# Patient Record
Sex: Female | Born: 1985 | Hispanic: No | Marital: Married | Smoking: Never smoker
Health system: Southern US, Community
[De-identification: ages and names within clinical notes are randomized; demographics above are authoritative.]

## PROBLEM LIST (undated history)

## (undated) ENCOUNTER — Inpatient Hospital Stay: Payer: Self-pay

## (undated) DIAGNOSIS — Z789 Other specified health status: Secondary | ICD-10-CM

## (undated) HISTORY — DX: Other specified health status: Z78.9

## (undated) HISTORY — PX: NO PAST SURGERIES: SHX2092

---

## 2015-04-15 ENCOUNTER — Emergency Department
Admission: EM | Admit: 2015-04-15 | Discharge: 2015-04-15 | Disposition: A | Discharge status: Home / Self Care | Payer: Medicaid Other | Attending: Emergency Medicine | Admitting: Emergency Medicine

## 2015-04-15 ENCOUNTER — Emergency Department: Payer: Medicaid Other

## 2015-04-15 ENCOUNTER — Encounter: Payer: Self-pay | Admitting: Emergency Medicine

## 2015-04-15 DIAGNOSIS — R42 Dizziness and giddiness: Secondary | ICD-10-CM | POA: Diagnosis not present

## 2015-04-15 DIAGNOSIS — O2342 Unspecified infection of urinary tract in pregnancy, second trimester: Secondary | ICD-10-CM | POA: Diagnosis not present

## 2015-04-15 DIAGNOSIS — Z3A16 16 weeks gestation of pregnancy: Secondary | ICD-10-CM | POA: Diagnosis not present

## 2015-04-15 DIAGNOSIS — N390 Urinary tract infection, site not specified: Secondary | ICD-10-CM

## 2015-04-15 DIAGNOSIS — O9989 Other specified diseases and conditions complicating pregnancy, childbirth and the puerperium: Secondary | ICD-10-CM | POA: Diagnosis present

## 2015-04-15 DIAGNOSIS — R109 Unspecified abdominal pain: Secondary | ICD-10-CM

## 2015-04-15 DIAGNOSIS — O26899 Other specified pregnancy related conditions, unspecified trimester: Secondary | ICD-10-CM

## 2015-04-15 LAB — URINALYSIS COMPLETE WITH MICROSCOPIC (ARMC ONLY)
Bilirubin Urine: NEGATIVE
GLUCOSE, UA: NEGATIVE mg/dL
Ketones, ur: NEGATIVE mg/dL
Nitrite: NEGATIVE
PROTEIN: NEGATIVE mg/dL
SPECIFIC GRAVITY, URINE: 1.025 (ref 1.005–1.030)
pH: 5 (ref 5.0–8.0)

## 2015-04-15 LAB — CBC
HEMATOCRIT: 36.2 % (ref 35.0–47.0)
HEMOGLOBIN: 12.3 g/dL (ref 12.0–16.0)
MCH: 29.8 pg (ref 26.0–34.0)
MCHC: 34 g/dL (ref 32.0–36.0)
MCV: 87.8 fL (ref 80.0–100.0)
Platelets: 232 10*3/uL (ref 150–440)
RBC: 4.12 MIL/uL (ref 3.80–5.20)
RDW: 13.5 % (ref 11.5–14.5)
WBC: 10.4 10*3/uL (ref 3.6–11.0)

## 2015-04-15 LAB — COMPREHENSIVE METABOLIC PANEL
ALBUMIN: 3.7 g/dL (ref 3.5–5.0)
ALT: 12 U/L — ABNORMAL LOW (ref 14–54)
ANION GAP: 6 (ref 5–15)
AST: 16 U/L (ref 15–41)
Alkaline Phosphatase: 38 U/L (ref 38–126)
BUN: 7 mg/dL (ref 6–20)
CHLORIDE: 107 mmol/L (ref 101–111)
CO2: 22 mmol/L (ref 22–32)
Calcium: 9.3 mg/dL (ref 8.9–10.3)
Creatinine, Ser: 0.32 mg/dL — ABNORMAL LOW (ref 0.44–1.00)
GFR calc Af Amer: >60 mL/min (ref >60)
GFR calc non Af Amer: >60 mL/min (ref >60)
GLUCOSE: 88 mg/dL (ref 65–99)
POTASSIUM: 3.6 mmol/L (ref 3.5–5.1)
Sodium: 135 mmol/L (ref 135–145)
Total Bilirubin: 0.2 mg/dL — ABNORMAL LOW (ref 0.3–1.2)
Total Protein: 7 g/dL (ref 6.5–8.1)

## 2015-04-15 LAB — HCG, QUANTITATIVE, PREGNANCY: HCG, BETA CHAIN, QUANT, S: 45629 m[IU]/mL — AB (ref <5)

## 2015-04-15 LAB — POCT PREGNANCY, URINE: PREG TEST UR: POSITIVE — AB

## 2015-04-15 LAB — LIPASE, BLOOD: LIPASE: 21 U/L (ref 11–51)

## 2015-04-15 MED ORDER — CEPHALEXIN 500 MG PO CAPS: 500.0000 mg | ORAL_CAPSULE | Freq: Three times a day (TID) | ORAL | Status: AC

## 2015-04-15 MED ORDER — CEPHALEXIN 500 MG PO CAPS
500.0000 mg | ORAL_CAPSULE | Freq: Once | ORAL | Status: AC
  Administered 2015-04-15: 500 mg via ORAL
  Filled 2015-04-15 (×2): qty 1

## 2015-04-15 MED ORDER — PRENATAL MULTIVITAMIN CH: 1.0000 | ORAL_TABLET | Freq: Every day | ORAL | Status: DC

## 2015-04-15 NOTE — ED Notes (Signed)
Pt is [redacted] weeks pregnant, pt reports dizziness since getting pregnant, pt reports dizziness occurs occasionally

## 2015-04-15 NOTE — ED Provider Notes (Signed)
[facility] Emergency Department Provider Note  ____________________________________________  Time seen: Approximately 515 PM  I have reviewed the triage vital signs and the nursing notes.   HISTORY  Chief Complaint Dizziness  Patient is Arabic speaking. I did bring the translator iPod in the room but the network was not able to communicate and therefore I was not able to obtain a translator. The patient's brother had to be used for Arabic translation.  HPI Tzirel Norva PavlovSalah Casale is a 29 y.o. female who is a G4 P3 who is presenting today at about14 weeks pregnant. The patient denies any vaginal bleeding or discharge. Says that she occasionally feels dizzy as well but is denying any dizziness or abdominal pain at this time. Says the dizziness is worse when she moves her head from side to side. Has not had anything like this in her previous pregnancies. Does not report any headache. Denies any dizziness right now. Denies any ringing or roaring in her ears. Patient says that the abdominal pain is to the bilateral sides of her abdomen and intermittent. She denies any pain with urination or frequency.   History reviewed. No pertinent past medical history.  There are no active problems to display for this patient.   History reviewed. No pertinent past surgical history.  No current outpatient prescriptions on file.  Allergies Review of patient's allergies indicates no known allergies.  History reviewed. No pertinent family history.  Social History Social History  Substance Use Topics  . Smoking status: Never Smoker   . Smokeless tobacco: None  . Alcohol Use: No    Review of Systems Constitutional: No fever/chills Eyes: No visual changes. ENT: No sore throat. Cardiovascular: Denies chest pain. Respiratory: Denies shortness of breath. Gastrointestinal:   No nausea, no vomiting.  No diarrhea.  No constipation. Genitourinary: Negative for  dysuria. Musculoskeletal: Negative for back pain. Skin: Negative for rash. Neurological: Negative for headaches, focal weakness or numbness.  10-point ROS otherwise negative.  ____________________________________________   PHYSICAL EXAM:  VITAL SIGNS: ED Triage Vitals  Enc Vitals Group     BP 04/15/15 1348 105/58 mmHg     Pulse Rate 04/15/15 1348 89     Resp 04/15/15 1348 20     Temp 04/15/15 1348 98.2 F (36.8 C)     Temp Source 04/15/15 1348 Oral     SpO2 04/15/15 1348 99 %     Weight --      Height --      Head Cir --      Peak Flow --      Pain Score 04/15/15 1348 4     Pain Loc --      Pain Edu? --      Excl. in GC? --     Constitutional: Alert and oriented. Well appearing and in no acute distress. Eyes: Conjunctivae are normal. PERRL. EOMI. Head: Atraumatic. Nose: No congestion/rhinnorhea. Mouth/Throat: Mucous membranes are moist.  Neck: No stridor.   Cardiovascular: Normal rate, regular rhythm. Grossly normal heart sounds.  Good peripheral circulation. Respiratory: Normal respiratory effort.  No retractions. Lungs CTAB. Gastrointestinal: Due to patients religion she did not want me palpating her abdomen. The patient and the family were also refusing a pelvic exam.  Musculoskeletal: No swelling of the ankles or feet. Neurologic:  Normal speech and language. No gross focal neurologic deficits are appreciated. No gait instability. Skin:  Skin is warm, dry and intact. No rash noted. Psychiatric: Mood and affect are normal. Speech and behavior  are normal.  ____________________________________________   LABS (all labs ordered are listed, but only abnormal results are displayed)  Labs Reviewed  COMPREHENSIVE METABOLIC PANEL - Abnormal; Notable for the following:    Creatinine, Ser 0.32 (*)    ALT 12 (*)    Total Bilirubin 0.2 (*)    All other components within normal limits  URINALYSIS COMPLETEWITH MICROSCOPIC ([facility] ONLY) - Abnormal; Notable for the  following:    Color, Urine AMBER (*)    APPearance CLOUDY (*)    Hgb urine dipstick 1+ (*)    Leukocytes, UA 3+ (*)    Bacteria, UA RARE (*)    Squamous Epithelial / LPF 6-30 (*)    All other components within normal limits  HCG, QUANTITATIVE, PREGNANCY - Abnormal; Notable for the following:    hCG, Beta Chain, Quant, S 16109 (*)    All other components within normal limits  POCT PREGNANCY, URINE - Abnormal; Notable for the following:    Preg Test, Ur POSITIVE (*)    All other components within normal limits  URINE CULTURE  LIPASE, BLOOD  CBC  POC URINE PREG, ED   ____________________________________________  EKG   ____________________________________________  RADIOLOGY  Single live IUP at estimated gestational age of [redacted] weeks 0 days. ____________________________________________   PROCEDURES  ____________________________________________   INITIAL IMPRESSION / ASSESSMENT AND PLAN / ED COURSE  Pertinent labs & imaging results that were available during my care of the patient were reviewed by me and considered in my medical decision making (see chart for details).  Exam limited because no abdominal or genitourinary exam permitted by the patient or family. Unfortunately all of the emergency doctors on staff right now her female in side do not have the option of referring to a female doctor for the rest of the exam.  ----------------------------------------- 7:26 PM on 04/15/2015 -----------------------------------------  Patient resting comfortable right now without any dizziness or abdominal pain. Possible dizziness related to urinary tract infection. History and physical exam do not point any central cause. We'll discharge with Keflex. Limited exam secondary to the patient's family do not want me to lay hands because of cultural/religious restrictions. However, the labs as well as general appearance a reassuring. ____________________________________________   FINAL  CLINICAL IMPRESSION(S) / ED DIAGNOSES  Final diagnoses:  Abdominal pain affecting pregnancy  Dizziness   UTI.     Myrna Blazer, MD 04/15/15 [phone]

## 2015-04-15 NOTE — ED Notes (Signed)
Pt to ed with c/o abd pain and dizziness.  Pt states she is approximately [redacted] weeks pregnant.  Pt denies vaginal bleeding. ( Interpreter Asmaa [phone].)

## 2015-04-18 LAB — URINE CULTURE

## 2015-04-27 NOTE — L&D Delivery Note (Signed)
Patient is 30 y.o. [id number] 6039w1d admitted for active labor.   Delivery Note At 5:20 AM a healthy female was delivered via Vaginal, Spontaneous Delivery (Presentation: Right Occiput Anterior). APGAR: 9, 9; weight  Pending.   Placenta status: Intact, Spontaneous.  Cord: 3 vessels with the following complications: None.    Anesthesia: Epidural  Episiotomy: None Lacerations: 2nd degree Suture Repair: 3.0 vicryl Est. Blood Loss (mL): 100  Mom to postpartum.  Baby to Couplet care / Skin to Skin.  [facility] Rumley 09/17/2015, 6:21 AM  OB fellow attestation: Patient is a [id number] at 6139w1d who was admitted in active labor, scant PNC  I was gloved and present for delivery in its entirety.  Second stage of labor progressed.   Complications: none  Lacerations: 2nd degree  EBL: 100  Federico FlakeKimberly Niles Abimael Zeiter, MD 6:54 AM

## 2015-05-24 ENCOUNTER — Encounter: Payer: Self-pay | Admitting: *Deleted

## 2015-05-24 ENCOUNTER — Observation Stay
Admission: EM | Admit: 2015-05-24 | Discharge: 2015-05-24 | Disposition: A | Discharge status: Home / Self Care | Payer: Medicaid Other | Attending: Obstetrics and Gynecology | Admitting: Obstetrics and Gynecology

## 2015-05-24 DIAGNOSIS — Z3A21 21 weeks gestation of pregnancy: Secondary | ICD-10-CM | POA: Diagnosis not present

## 2015-05-24 DIAGNOSIS — O0932 Supervision of pregnancy with insufficient antenatal care, second trimester: Secondary | ICD-10-CM | POA: Diagnosis not present

## 2015-05-24 DIAGNOSIS — O26892 Other specified pregnancy related conditions, second trimester: Secondary | ICD-10-CM | POA: Diagnosis not present

## 2015-05-24 DIAGNOSIS — R109 Unspecified abdominal pain: Secondary | ICD-10-CM | POA: Diagnosis not present

## 2015-05-24 LAB — URINALYSIS COMPLETE WITH MICROSCOPIC (ARMC ONLY)
Bilirubin Urine: NEGATIVE
Glucose, UA: NEGATIVE mg/dL
Nitrite: NEGATIVE
PH: 5 (ref 5.0–8.0)
Protein, ur: 30 mg/dL — AB
SPECIFIC GRAVITY, URINE: 1.024 (ref 1.005–1.030)

## 2015-05-24 LAB — CBC WITH DIFFERENTIAL/PLATELET
Basophils Absolute: 0 10*3/uL (ref 0–0.1)
Basophils Relative: 0 %
EOS PCT: 4 %
Eosinophils Absolute: 0.4 10*3/uL (ref 0–0.7)
HCT: 32.1 % — ABNORMAL LOW (ref 35.0–47.0)
HEMOGLOBIN: 11.2 g/dL — AB (ref 12.0–16.0)
Lymphocytes Relative: 19 %
Lymphs Abs: 2.1 10*3/uL (ref 1.0–3.6)
MCH: 30.6 pg (ref 26.0–34.0)
MCHC: 35 g/dL (ref 32.0–36.0)
MCV: 87.6 fL (ref 80.0–100.0)
Monocytes Absolute: 0.7 10*3/uL (ref 0.2–0.9)
Monocytes Relative: 6 %
NEUTROS PCT: 71 %
Neutro Abs: 8.1 10*3/uL — ABNORMAL HIGH (ref 1.4–6.5)
PLATELETS: 225 10*3/uL (ref 150–440)
RBC: 3.66 MIL/uL — AB (ref 3.80–5.20)
RDW: 13.1 % (ref 11.5–14.5)
WBC: 11.5 10*3/uL — AB (ref 3.6–11.0)

## 2015-05-24 LAB — ABO/RH: ABO/RH(D): A NEG

## 2015-05-24 MED ORDER — NITROFURANTOIN MONOHYD MACRO 100 MG PO CAPS
100.0000 mg | ORAL_CAPSULE | Freq: Two times a day (BID) | ORAL | Status: AC
  Administered 2015-05-24: 100 mg via ORAL
  Filled 2015-05-24: qty 1

## 2015-05-24 NOTE — OB Triage Note (Signed)
Abdominal cramping, has not seen a provider during this pregnancy. Ultrasound done on 04/15/15. Alison Salazar

## 2015-05-24 NOTE — OB Triage Provider Note (Signed)
L&D OB Triage Note  Alison Salazar is a 30 y.o. G4P0 female at Unknown, EDD Estimated Date of Delivery: None noted. who presented to triage for complaints of mid-abdominal pains intermittent x 2 weeks. No prenaatl care to date, did have u/s here on 04/15/15 with EDC found to be 09/30/15 and EGA 16w at that time. Patient reports she is [id number] with term deliveries and no health issues. Is not taking any medications at this time.  She was evaluated by the nurses with no significant findings/findings significant for PTL. Vital signs stable.  O: abdomen soft and nontender, FH c/w 21 weeks, no contractions noted on EFM, FHT 160. Urine sent for analysis and Prenatal labs obtained.   A: abdominal pain of unknown etiology at [redacted] weeks gestation. No prenatal care to date   Mode: Doppler Baseline Rate (A): 150 bpm              A; abdominal pain of unknown etiology at 21 weeks, No prenatal care.  P: discharge, to set up at appointment with my office within one week to establish care and anatomy scan.      Kentrell Hallahan Suzan Nailer, CNM

## 2015-05-25 LAB — URINE CULTURE

## 2015-05-25 LAB — RUBELLA ANTIBODY, IGM: Rubella IgM: <20 AU/mL (ref 0.0–19.9)

## 2015-05-25 LAB — HIV ANTIBODY (ROUTINE TESTING W REFLEX): HIV Screen 4th Generation wRfx: NONREACTIVE

## 2015-05-25 LAB — VARICELLA ZOSTER ANTIBODY, IGG: VARICELLA IGG: 141 {index} — AB (ref >165)

## 2015-05-25 LAB — HEPATITIS B CORE ANTIBODY, IGM: HEP B C IGM: NEGATIVE

## 2015-05-25 LAB — RPR: RPR: NONREACTIVE

## 2015-06-04 ENCOUNTER — Ambulatory Visit (INDEPENDENT_AMBULATORY_CARE_PROVIDER_SITE_OTHER): Payer: Medicaid Other | Admitting: Obstetrics and Gynecology

## 2015-06-04 VITALS — BP 87/49 | HR 90 | Ht 62.0 in | Wt 142.7 lb

## 2015-06-04 DIAGNOSIS — Z87440 Personal history of urinary (tract) infections: Secondary | ICD-10-CM | POA: Diagnosis not present

## 2015-06-04 DIAGNOSIS — Z113 Encounter for screening for infections with a predominantly sexual mode of transmission: Secondary | ICD-10-CM | POA: Diagnosis not present

## 2015-06-04 DIAGNOSIS — O9989 Other specified diseases and conditions complicating pregnancy, childbirth and the puerperium: Secondary | ICD-10-CM | POA: Diagnosis not present

## 2015-06-04 DIAGNOSIS — O360120 Maternal care for anti-D [Rh] antibodies, second trimester, not applicable or unspecified: Secondary | ICD-10-CM

## 2015-06-04 DIAGNOSIS — O0932 Supervision of pregnancy with insufficient antenatal care, second trimester: Secondary | ICD-10-CM | POA: Diagnosis not present

## 2015-06-04 DIAGNOSIS — Z2839 Other underimmunization status: Secondary | ICD-10-CM

## 2015-06-04 DIAGNOSIS — Z283 Underimmunization status: Secondary | ICD-10-CM

## 2015-06-04 DIAGNOSIS — Z789 Other specified health status: Secondary | ICD-10-CM | POA: Diagnosis not present

## 2015-06-04 DIAGNOSIS — O09899 Supervision of other high risk pregnancies, unspecified trimester: Secondary | ICD-10-CM

## 2015-06-04 NOTE — Patient Instructions (Signed)
Glucose Tolerance Test The glucose tolerance test (GTT) is one of several tests used to diagnose diabetes mellitus. The GTT is a blood test, and it may include a urine test as well. The GTT checks to see how your body processes sugar (glucose). For this test, you will consume a drink containing a high level of glucose. Your blood glucose levels will be checked before you consume the drink and then again 1, 2, 3, and possibly 4 hours after you consume it. Your health care provider may recommend that you have the GTT if you:  Have a family history of diabetes.   Are very overweight (obese).   Have experienced infections that keep coming back.   Have had numerous cuts or wounds that did not heal quickly, especially on your legs and feet.   Are a woman and have a history of giving birth to very large babies or a history of repeated fetal loss (stillbirth).  Have had glucose in your urine or high blood sugar:   During pregnancy.   After a heart attack, surgery, or prolonged periods of high stress.  The GTT lasts 3-4 hours. Other than the glucose solution, you will not be allowed to eat or drink anything during the test. You must remain at the testing location to make sure that your blood and urine samples are taken on time. PREPARATION FOR TEST Eat normally for 3 days prior to the GTT test, including having plenty of carbohydrate-rich foods. Do not eat or drink anything except water during the final 12 hours before the test. You should not smoke or exercise during the test. In addition, your health care provider may ask you to stop taking certain medicines before the test. RESULTS It is your responsibility to obtain your test results. Ask the lab or department performing the test when and how you will get your results. Contact your health care provider to discuss any questions you have about your results. Range of Normal Values Ranges for normal values may vary among different labs and  hospitals. You should always check with your health care provider after having lab work or other tests done to discuss whether your values are considered within normal limits.  Normal levels of blood glucose are as follows:  Fasting: less than 110 mg/dL or less than 6.1 mmol/L (SI units).  1 hour after consuming the glucose drink: less than 200 mg/dL or less than 11.1 mmol/L.  2 hours after consuming the glucose drink: less than 140 mg/dL or less than 7.8 mmol/L.  3 hours after consuming the glucose drink: 70-115 mg/dL or less than 6.4 mmol/L.  4 hours after consuming the glucose drink: 70-115 mg/dL or less than 6.4 mmol/L. The normal result for the urine test is negative, meaning that glucose is absent from your urine. Some substances can interfere with GTT results. These may include:  Blood pressure and heart failure medicines, including beta blockers, furosemide, and thiazides.   Anti-inflammatory medicines, including aspirin.   Nicotine.   Some psychiatric medicines.   Oral contraceptives.   Diuretics or corticosteroids. Meaning of Results Outside Normal Value Ranges GTT test results that are above normal values may indicate health problems, such as:  Diabetes mellitus.   Acute stress response.   Cushing syndrome.   Tumors such as pheochromocytoma or glucagonoma.   Chronic renal failure.   Pancreatitis.   Hyperthyroidism.   Current infection.  Discuss your test results with your health care provider. He or she will use the results   to make a diagnosis and determine a treatment plan that is right for you.   This information is not intended to replace advice given to you by your health care provider. Make sure you discuss any questions you have with your health care provider.   Document Released: 05/05/2004 Document Revised: 05/03/2014 Document Reviewed: 08/17/2013 Elsevier Interactive Patient Education 2016 [employer].     Prenatal Care WHAT IS  PRENATAL CARE?  Prenatal care is the process of caring for a pregnant woman before she gives birth. Prenatal care makes sure that she and her baby remain as healthy as possible throughout pregnancy. Prenatal care may be provided by a midwife, family practice health care provider, or a childbirth and pregnancy specialist (obstetrician). Prenatal care may include physical examinations, testing, treatments, and education on nutrition, lifestyle, and social support services. WHY IS PRENATAL CARE SO IMPORTANT?  Early and consistent prenatal care increases the chance that you and your baby will remain healthy throughout your pregnancy. This type of care also decreases a baby's risk of being born too early (prematurely), or being born smaller than expected (small for gestational age). Any underlying medical conditions you may have that could pose a risk during your pregnancy are discussed during prenatal care visits. You will also be monitored regularly for any new conditions that may arise during your pregnancy so they can be treated quickly and effectively. WHAT HAPPENS DURING PRENATAL CARE VISITS? Prenatal care visits may include the following: Discussion Tell your health care provider about any new signs or symptoms you have experienced since your last visit. These might include:  Nausea or vomiting.  Increased or decreased level of energy.  Difficulty sleeping.  Back or leg pain.  Weight changes.  Frequent urination.  Shortness of breath with physical activity.  Changes in your skin, such as the development of a rash or itchiness.  Vaginal discharge or bleeding.  Feelings of excitement or nervousness.  Changes in your baby's movements. You may want to write down any questions or topics you want to discuss with your health care provider and bring them with you to your appointment. Examination During your first prenatal care visit, you will likely have a complete physical exam. Your  health care provider will often examine your vagina, cervix, and the position of your uterus, as well as check your heart, lungs, and other body systems. As your pregnancy progresses, your health care provider will measure the size of your uterus and your baby's position inside your uterus. He or she may also examine you for early signs of labor. Your prenatal visits may also include checking your blood pressure and, after about 10-12 weeks of pregnancy, listening to your baby's heartbeat. Testing Regular testing often includes:  Urinalysis. This checks your urine for glucose, protein, or signs of infection.  Blood count. This checks the levels of white and red blood cells in your body.  Tests for sexually transmitted infections (STIs). Testing for STIs at the beginning of pregnancy is routinely done and is required in many states.  Antibody testing. You will be checked to see if you are immune to certain illnesses, such as rubella, that can affect a developing fetus.  Glucose screen. Around 24-28 weeks of pregnancy, your blood glucose level will be checked for signs of gestational diabetes. Follow-up tests may be recommended.  Group B strep. This is a bacteria that is commonly found inside a woman's vagina. This test will inform your health care provider if you need an antibiotic  to reduce the amount of this bacteria in your body prior to labor and childbirth.  Ultrasound. Many pregnant women undergo an ultrasound screening around 18-20 weeks of pregnancy to evaluate the health of the fetus and check for any developmental abnormalities.  HIV (human immunodeficiency virus) testing. Early in your pregnancy, you will be screened for HIV. If you are at high risk for HIV, this test may be repeated during your third trimester of pregnancy. You may be offered other testing based on your age, personal or family medical history, or other factors.  HOW OFTEN SHOULD I PLAN TO SEE MY HEALTH CARE PROVIDER  FOR PRENATAL CARE? Your prenatal care check-up schedule depends on any medical conditions you have before, or develop during, your pregnancy. If you do not have any underlying medical conditions, you will likely be seen for checkups:  Monthly, during the first 6 months of pregnancy.  Twice a month during months 7 and 8 of pregnancy.  Weekly starting in the 9th month of pregnancy and until delivery. If you develop signs of early labor or other concerning signs or symptoms, you may need to see your health care provider more often. Ask your health care provider what prenatal care schedule is best for you. WHAT CAN I DO TO KEEP MYSELF AND MY BABY AS HEALTHY AS POSSIBLE DURING MY PREGNANCY?  Take a prenatal vitamin containing 400 micrograms (0.4 mg) of folic acid every day. Your health care provider may also ask you to take additional vitamins such as iodine, vitamin D, iron, copper, and zinc.  Take 1500-2000 mg of calcium daily starting at your 20th week of pregnancy until you deliver your baby.  Make sure you are up to date on your vaccinations. Unless directed otherwise by your health care provider:  You should receive a tetanus, diphtheria, and pertussis (Tdap) vaccination between the 27th and 36th week of your pregnancy, regardless of when your last Tdap immunization occurred. This helps protect your baby from whooping cough (pertussis) after he or she is born.  You should receive an annual inactivated influenza vaccine (IIV) to help protect you and your baby from influenza. This can be done at any point during your pregnancy.  Eat a well-rounded diet that includes:  Fresh fruits and vegetables.  Lean proteins.  Calcium-rich foods such as milk, yogurt, hard cheeses, and dark, leafy greens.  Whole grain breads.  Do noteat seafood high in mercury, including:  Swordfish.  Tilefish.  Shark.  King mackerel.  More than 6 oz tuna per week.  Do not eat:  Raw or undercooked meats  or eggs.  Unpasteurized foods, such as soft cheeses (brie, blue, or feta), juices, and milks.  Lunch meats.  Hot dogs that have not been heated until they are steaming.  Drink enough water to keep your urine clear or pale yellow. For many women, this may be 10 or more 8 oz glasses of water each day. Keeping yourself hydrated helps deliver nutrients to your baby and may prevent the start of pre-term uterine contractions.  Do not use any tobacco products including cigarettes, chewing tobacco, or electronic cigarettes. If you need help quitting, ask your health care provider.  Do not drink beverages containing alcohol. No safe level of alcohol consumption during pregnancy has been determined.  Do not use any illegal drugs. These can harm your developing baby or cause a miscarriage.  Ask your health care provider or pharmacist before taking any prescription or over-the-counter medicines, herbs, or supplements.  Limit  your caffeine intake to no more than 200 mg per day.  Exercise. Unless told otherwise by your health care provider, try to get 30 minutes of moderate exercise most days of the week. Do not  do high-impact activities, contact sports, or activities with a high risk of falling, such as horseback riding or downhill skiing.  Get plenty of rest.  Avoid anything that raises your body temperature, such as hot tubs and saunas.  If you own a cat, do not empty its litter box. Bacteria contained in cat feces can cause an infection called toxoplasmosis. This can result in serious harm to the fetus.  Stay away from chemicals such as insecticides, lead, mercury, and cleaning or paint products that contain solvents.  Do not have any X-rays taken unless medically necessary.  Take a childbirth and breastfeeding preparation class. Ask your health care provider if you need a referral or recommendation.   This information is not intended to replace advice given to you by your health care  provider. Make sure you discuss any questions you have with your health care provider.   Document Released: 04/15/2003 Document Revised: 05/03/2014 Document Reviewed: 06/27/2013 Elsevier Interactive Patient Education 2016 Else[employer] Inc[employer]    Rh Incompatibility Rh incompatibility is a condition that occurs during pregnancy if a woman has Rh-negative blood and her baby has Rh-positive blood. "Rh-negative" and "Rh-positive" refer to whether or not the blood has an Rh factor. An Rh factor is a specific protein found on the surface of red blood cells. If a woman has Rh factor, she is Rh-positive. If she does not have an Rh factor, she is Rh-negative. Having or not having an Rh factor does not affect the mother's general health. However, it can cause problems during pregnancy.  WHAT KIND OF PROBLEMS CAN Rh INCOMPATIBILITY CAUSE? During pregnancy, blood from the baby can cross into the mother's bloodstream, especially during delivery. If a mother is Rh-negative and the baby is Rh-positive, the mother's defense system will react to the baby's blood as if it was a foreign substance and will create proteins (antibodies). This is called sensitization. Once the mother is sensitized, her Rh antibodies will cross the placenta to the baby and attack the baby's Rh-positive blood as if it is a harmful substance.  Rh incompatibility can also happen if the Rh-negative pregnant woman is exposed to the Rh factor during a blood transfusion with Rh-positive blood.  HOW DOES THIS CONDITION AFFECT MY BABY? The Rh antibodies that attack and destroy the baby's red blood cells can lead to hemolytic disease in the baby. Hemolytic disease is when the red blood cells break down. This can cause:   Yellowing of the skin and eyes (jaundice).  The body to not have enough healthy red blood cells (anemia).   Brain damage.   Heart failure.   Death.  These antibodies usually do not cause problems during a first pregnancy. This  is because the blood from the baby often times crosses into the mother's bloodstream during delivery, and the baby is born before many of the antibodies can develop. However, the antibodies stay in your body once they have formed. Because of this, Rh incompatibility is more likely to cause problems in second or later pregnancies (if the baby is Rh-positive).  HOW IS THIS CONDITION DIAGNOSED? When a woman becomes pregnant, blood tests may be done to find out her blood type and Rh factor. If the woman is Rh-negative, she also may have another blood test called an  antibody screen. The antibody screen shows whether she has Rh antibodies in her blood. If she does, it means she was exposed to Rh-positive blood before, and she is at risk for Rh incompatibility.  To find out whether the baby is developing hemolytic anemia and how serious it is, caregivers may use more advanced tests, such as ultrasonography (commonly known as ultrasound).  HOW IS Rh INCOMPATIBILITY TREATED?  Rh incompatibility is treated with a shot of medicine called Rho (D) immune globulin. This medicine keeps the woman's body from making antibodies that can cause serious problems in the baby or future babies.  Two shots will be given, one at around your seventh month of pregnancy and the other within 72 hours of your baby being born. If you are Rh-negative, you will need this medicine every time you have a baby with Rh-positive blood. If you already have antibodies in your blood, Rho (D) immune globulin will not help. Your doctor will not give you this medicine, but will watch your pregnancy closely for problems instead.  This shot may also be given to an Rh-negative woman when the risk of blood transfer between the mom and baby is high. The risk is high with:   An amniocentesis.   A miscarriage or an abortion.   An ectopic pregnancy.   Any vaginal bleeding during pregnancy.    This information is not intended to replace advice  given to you by your health care provider. Make sure you discuss any questions you have with your health care provider.   Document Released: 10/02/2001 Document Revised: 04/17/2013 Document Reviewed: 07/25/2012 Elsevier Interactive Patient Education [employer]

## 2015-06-04 NOTE — Progress Notes (Signed)
Pt unable to void for urine screenings.

## 2015-06-07 ENCOUNTER — Encounter: Payer: Self-pay | Admitting: Obstetrics and Gynecology

## 2015-06-07 DIAGNOSIS — O26899 Other specified pregnancy related conditions, unspecified trimester: Secondary | ICD-10-CM

## 2015-06-07 DIAGNOSIS — Z6791 Unspecified blood type, Rh negative: Secondary | ICD-10-CM | POA: Insufficient documentation

## 2015-06-07 DIAGNOSIS — O9989 Other specified diseases and conditions complicating pregnancy, childbirth and the puerperium: Secondary | ICD-10-CM

## 2015-06-07 DIAGNOSIS — O09899 Supervision of other high risk pregnancies, unspecified trimester: Secondary | ICD-10-CM | POA: Insufficient documentation

## 2015-06-07 DIAGNOSIS — Z2839 Other underimmunization status: Secondary | ICD-10-CM | POA: Insufficient documentation

## 2015-06-07 DIAGNOSIS — O0933 Supervision of pregnancy with insufficient antenatal care, third trimester: Secondary | ICD-10-CM | POA: Insufficient documentation

## 2015-06-07 DIAGNOSIS — Z283 Underimmunization status: Secondary | ICD-10-CM | POA: Insufficient documentation

## 2015-06-07 NOTE — Progress Notes (Signed)
OBSTETRIC CLINIC VISIT PROGRESS NOTE  Subjective:   Alison Salazar is a 30 y.o. [phone] [redacted]w[redacted]d Arabic speaking female being seen today for her obstetrical visit.  Estimated Date of Delivery: 09/30/15 based on 16 week limited ER sono (performed 04/15/2015).  She is following up from an Emergency Room visit last week for abdominal pain.  Patient was diagnosed with a UTI at that visit.  Her OB history is significant for SVD x 3 (denies complications in pregnancy or delivery), and no prenatal care to date.   Patient reports no complaints today.  Denies contractions, vaginal bleeding or leaking of fluid.  Reports good fetal movement.  Notes that she desires to know the sex of the baby.   The following portions of the patient's history were reviewed and updated as appropriate: allergies, current medications, past family history, past medical history, past social history, past surgical history and problem list.     OB History  Gravida Para Term Preterm AB SAB TAB Ectopic Multiple Living  0 0 0 0 0 0 3    # Outcome Date GA Lbr Len/2nd Weight Sex Delivery Anes PTL Lv  4 Current           3 Term      Vag-Spont     2 Term      Vag-Spont     1 Term      Vag-Spont        Menarche at age 45.  Denies h/o STI's or abnormal pap smears.  Last pap smear was ~ 2-4 years ago (patient cannot recall).    Past Medical History  Diagnosis Date  . Medical history non-contributory     No family history on file.     Past Surgical History  Procedure Laterality Date  . No past surgeries      Social History   Social History  . Marital Status: Married    Spouse Name: N/A  . Number of Children: N/A  . Years of Education: N/A   Occupational History  . Not on file.   Social History Main Topics  . Smoking status: Never Smoker   . Smokeless tobacco: Not on file  . Alcohol Use: No  . Drug Use: No  . Sexual Activity: Yes   Other Topics Concern  . Not on file   Social History Narrative      Current Outpatient Prescriptions on File Prior to Visit  Medication Sig Dispense Refill  . Prenatal Vit-Fe Fumarate-FA (PRENATAL MULTIVITAMIN) TABS tablet Take 1 tablet by mouth daily at 12 noon. (Patient not taking: Reported on 05/24/2015) 30 tablet 0   No current facility-administered medications on file prior to visit.    No Known Allergies   Objective:  BP 87/49 mmHg  Pulse 90  Ht  (1.575 m)  Wt 142 lb 11.2 oz (64.728 kg)  BMI 26.09 kg/m2  Patient declined full OB exam today.   FHT:  148 bpm  Uterine Size:  23  Fetal Movement: Movement: Present  Presentation:  Not applicable.    Abdomen:  soft, gravid, appropriate for gestational age,non-tender  Vaginal: Declined  Cervix: Declined   No results found for this or any previous visit (from the past 24 hour(s)).  Assessment and Plan:   Pregnancy:  [id number] at [redacted]w[redacted]d  1. Late prenatal care affecting pregnancy - Glucose, 1 hour gestational; Future - Hemoglobin and hematocrit, blood; Future - US OB Comp + 14 Wk; Future (ASAP) - GC/chlamydia  probe amp, urine - Urinalysis, Routine w reflex microscopic (not at [facility])/Urine Culture - unable to perform as patient unable to void.  - Patient unsure of pap history, likely performed with last pregnancy.  Will attempt to get records. Patient relocated from [place] ~ 6 months ago.    - New OB counseling:  The patient has been given an overview regarding routine prenatal care. Recommendations regarding diet, weight gain, and exercise in pregnancy were given.  Prenatal testing, optional genetic testing, and ultrasound use in pregnancy were reviewed.  Benefits of Breast Feeding were discussed. The patient is encouraged to consider nursing her baby post partum. - Initial labs reviewed (drawn during ER visit).  Patient is Rubella and Varicella immune.  Will need Vaccines postpartum.  - Patient desires to be seen by midwife.  Will refer to [facility].   2. Screening for STD (sexually  transmitted disease) - GC/chlamydia probe amp, urine   3. History of UTI - Urinalysis, Routine w reflex microscopic (not at [facility])/Urine Culture - unable to perform as patient unable to void.  Will need TOC at next visit.    4. Rh negative status - Will need Rhogam at [redacted] weeks gestation.    Follow up in 4 weeks.  A total of 30 minutes were spent face-to-face with the patient during this encounter and over half of that time involved counseling and coordination of care.   Hildred Laser, MD

## 2015-06-12 ENCOUNTER — Other Ambulatory Visit: Payer: Self-pay

## 2015-06-20 ENCOUNTER — Ambulatory Visit (INDEPENDENT_AMBULATORY_CARE_PROVIDER_SITE_OTHER): Payer: Medicaid Other

## 2015-06-20 DIAGNOSIS — O0932 Supervision of pregnancy with insufficient antenatal care, second trimester: Secondary | ICD-10-CM

## 2015-07-03 ENCOUNTER — Encounter: Payer: Self-pay | Admitting: Obstetrics and Gynecology

## 2015-07-24 ENCOUNTER — Encounter: Payer: Self-pay | Admitting: Obstetrics and Gynecology

## 2015-07-24 ENCOUNTER — Ambulatory Visit (INDEPENDENT_AMBULATORY_CARE_PROVIDER_SITE_OTHER): Payer: Self-pay | Admitting: Obstetrics and Gynecology

## 2015-07-24 VITALS — BP 78/55 | HR 99 | Wt 150.4 lb

## 2015-07-24 DIAGNOSIS — Z3493 Encounter for supervision of normal pregnancy, unspecified, third trimester: Secondary | ICD-10-CM

## 2015-07-24 DIAGNOSIS — O0932 Supervision of pregnancy with insufficient antenatal care, second trimester: Secondary | ICD-10-CM

## 2015-07-24 DIAGNOSIS — Z3483 Encounter for supervision of other normal pregnancy, third trimester: Secondary | ICD-10-CM

## 2015-07-24 MED ORDER — CONCEPT DHA 53.5-38-1 MG PO CAPS: 1.0000 | ORAL_CAPSULE | Freq: Every day | ORAL | Status: AC

## 2015-07-26 NOTE — Progress Notes (Signed)
ROB: Patient denies complaints.  Unable to void today.  Did not complete 1 hr glucola today, but will perform by next visit.  RTC in 2 weeks. Unable to void today.

## 2015-08-06 ENCOUNTER — Ambulatory Visit (INDEPENDENT_AMBULATORY_CARE_PROVIDER_SITE_OTHER): Payer: Medicaid Other | Admitting: Obstetrics and Gynecology

## 2015-08-06 ENCOUNTER — Encounter: Payer: Self-pay | Admitting: Obstetrics and Gynecology

## 2015-08-06 VITALS — BP 86/52 | HR 92 | Wt 151.5 lb

## 2015-08-06 DIAGNOSIS — B3731 Acute candidiasis of vulva and vagina: Secondary | ICD-10-CM

## 2015-08-06 DIAGNOSIS — Z23 Encounter for immunization: Secondary | ICD-10-CM | POA: Diagnosis not present

## 2015-08-06 DIAGNOSIS — O360130 Maternal care for anti-D [Rh] antibodies, third trimester, not applicable or unspecified: Secondary | ICD-10-CM

## 2015-08-06 DIAGNOSIS — Z3493 Encounter for supervision of normal pregnancy, unspecified, third trimester: Secondary | ICD-10-CM

## 2015-08-06 DIAGNOSIS — Z3483 Encounter for supervision of other normal pregnancy, third trimester: Secondary | ICD-10-CM | POA: Diagnosis not present

## 2015-08-06 DIAGNOSIS — Z124 Encounter for screening for malignant neoplasm of cervix: Secondary | ICD-10-CM

## 2015-08-06 DIAGNOSIS — B373 Candidiasis of vulva and vagina: Secondary | ICD-10-CM

## 2015-08-06 LAB — POCT URINALYSIS DIPSTICK
Bilirubin, UA: NEGATIVE
GLUCOSE UA: NEGATIVE
KETONES UA: NEGATIVE
Nitrite, UA: NEGATIVE
SPEC GRAV UA: 1.01
UROBILINOGEN UA: NEGATIVE
pH, UA: 8

## 2015-08-06 MED ORDER — TERCONAZOLE 0.4 % VA CREA: 1.0000 | TOPICAL_CREAM | Freq: Every day | VAGINAL | Status: DC

## 2015-08-06 MED ORDER — RHO D IMMUNE GLOBULIN 1500 UNITS IM SOSY
1500.0000 [IU] | PREFILLED_SYRINGE | Freq: Once | INTRAMUSCULAR | Status: AC
  Administered 2015-08-06: 1500 [IU] via INTRAMUSCULAR

## 2015-08-06 NOTE — Progress Notes (Signed)
ROB: Patient complains of vaginal itching x 3 months.  Exam with moderate amount of thick clumpy discharge.  Wet prep performed, positive for yeast.  Will treat with Terazole cream.  For Tdap and Rhogam today. Performing glucola today.  Pap smear also performed today as patient declined pelvic exams in earlier part of pregnancy. RTC in 2 weeks.

## 2015-08-07 LAB — HEMOGLOBIN AND HEMATOCRIT, BLOOD
Hematocrit: 32.4 % — ABNORMAL LOW (ref 34.0–46.6)
Hemoglobin: 11 g/dL — ABNORMAL LOW (ref 11.1–15.9)

## 2015-08-07 LAB — GLUCOSE, 1 HOUR GESTATIONAL: Gestational Diabetes Screen: 184 mg/dL — ABNORMAL HIGH (ref 65–139)

## 2015-08-09 LAB — PAP IG W/ RFLX HPV ASCU: PAP Smear Comment: 0

## 2015-08-11 ENCOUNTER — Telehealth: Payer: Self-pay

## 2015-08-11 NOTE — Telephone Encounter (Signed)
-----   Message from Hildred LaserAnika Cherry, MD sent at 08/07/2015  1:59 PM EDT ----- Please notify patient that 1 hour glucose screen was abnormal, needs 3 hour testing.

## 2015-08-11 NOTE — Telephone Encounter (Signed)
Called pt using language line, pt's brother answers and states that the pt is not with him currently. Will call again later.

## 2015-08-12 NOTE — Telephone Encounter (Signed)
Called pt using language line, pt informed of the need to come back in for 3hr testing. Pt gave verbal understanding. Orders placed.

## 2015-08-13 ENCOUNTER — Other Ambulatory Visit: Payer: Self-pay | Admitting: Obstetrics and Gynecology

## 2015-08-13 ENCOUNTER — Other Ambulatory Visit: Payer: Self-pay

## 2015-08-13 ENCOUNTER — Other Ambulatory Visit: Payer: Medicaid Other

## 2015-08-13 DIAGNOSIS — Z131 Encounter for screening for diabetes mellitus: Secondary | ICD-10-CM

## 2015-08-14 LAB — GESTATIONAL GLUCOSE TOLERANCE
GLUCOSE 1 HOUR GTT: 210 mg/dL — AB (ref 65–179)
Glucose, Fasting: 89 mg/dL (ref 65–94)
Glucose, GTT - 2 Hour: 164 mg/dL — ABNORMAL HIGH (ref 65–154)
Glucose, GTT - 3 Hour: 134 mg/dL (ref 65–139)

## 2015-08-21 ENCOUNTER — Encounter: Payer: Medicaid Other | Admitting: Obstetrics and Gynecology

## 2015-08-22 ENCOUNTER — Telehealth: Payer: Self-pay

## 2015-08-22 DIAGNOSIS — O24419 Gestational diabetes mellitus in pregnancy, unspecified control: Secondary | ICD-10-CM

## 2015-08-22 DIAGNOSIS — R7309 Other abnormal glucose: Secondary | ICD-10-CM

## 2015-08-22 NOTE — Telephone Encounter (Signed)
-----   Message from Hildred LaserAnika Cherry, MD sent at 08/21/2015  8:17 PM EDT ----- Please inform patient of abnormal 3 hr glucola (has 2 values elevated).  Has GDM.  Needs referral to [facility].

## 2015-08-22 NOTE — Telephone Encounter (Signed)
Called pt's brother (listed on [employer]) advised him of the pt's dx and the need to set up appointment with [facility]. Pt's brother gave verbal understanding. Referral placed.

## 2015-09-04 ENCOUNTER — Encounter: Payer: Medicaid Other | Admitting: Obstetrics and Gynecology

## 2015-09-09 ENCOUNTER — Ambulatory Visit: Payer: Medicaid Other | Admitting: *Deleted

## 2015-09-12 ENCOUNTER — Ambulatory Visit: Payer: Medicaid Other | Admitting: *Deleted

## 2015-09-16 ENCOUNTER — Encounter (HOSPITAL_COMMUNITY): Payer: Self-pay

## 2015-09-16 ENCOUNTER — Encounter: Payer: Self-pay | Admitting: *Deleted

## 2015-09-16 ENCOUNTER — Inpatient Hospital Stay
Admission: EM | Admit: 2015-09-16 | Discharge: 2015-09-16 | Disposition: A | Discharge status: Home / Self Care | Payer: Medicaid Other | Admitting: Obstetrics and Gynecology

## 2015-09-16 ENCOUNTER — Inpatient Hospital Stay (HOSPITAL_COMMUNITY): Payer: Medicaid Other | Admitting: Anesthesiology

## 2015-09-16 ENCOUNTER — Telehealth: Payer: Self-pay | Admitting: Obstetrics and Gynecology

## 2015-09-16 ENCOUNTER — Inpatient Hospital Stay (HOSPITAL_COMMUNITY)
Admission: AD | Admit: 2015-09-16 | Discharge: 2015-09-18 | DRG: 775 | Disposition: A | Discharge status: Home / Self Care | Payer: Medicaid Other | Source: Ambulatory Visit | Attending: Family Medicine | Admitting: Family Medicine

## 2015-09-16 DIAGNOSIS — O0930 Supervision of pregnancy with insufficient antenatal care, unspecified trimester: Secondary | ICD-10-CM | POA: Diagnosis not present

## 2015-09-16 DIAGNOSIS — IMO0001 Reserved for inherently not codable concepts without codable children: Secondary | ICD-10-CM

## 2015-09-16 DIAGNOSIS — O0933 Supervision of pregnancy with insufficient antenatal care, third trimester: Secondary | ICD-10-CM

## 2015-09-16 DIAGNOSIS — Z3A38 38 weeks gestation of pregnancy: Secondary | ICD-10-CM

## 2015-09-16 DIAGNOSIS — Z6791 Unspecified blood type, Rh negative: Secondary | ICD-10-CM

## 2015-09-16 DIAGNOSIS — Z79899 Other long term (current) drug therapy: Secondary | ICD-10-CM | POA: Diagnosis not present

## 2015-09-16 LAB — CBC
HEMATOCRIT: 32.8 % — AB (ref 36.0–46.0)
HEMOGLOBIN: 10.8 g/dL — AB (ref 12.0–15.0)
MCH: 26.6 pg (ref 26.0–34.0)
MCHC: 32.9 g/dL (ref 30.0–36.0)
MCV: 80.8 fL (ref 78.0–100.0)
PLATELETS: 289 10*3/uL (ref 150–400)
RBC: 4.06 MIL/uL (ref 3.87–5.11)
RDW: 13.4 % (ref 11.5–15.5)
WBC: 14.4 10*3/uL — AB (ref 4.0–10.5)

## 2015-09-16 MED ORDER — PHENYLEPHRINE 40 MCG/ML (10ML) SYRINGE FOR IV PUSH (FOR BLOOD PRESSURE SUPPORT): 80.0000 ug | PREFILLED_SYRINGE | INTRAVENOUS | Status: DC | PRN

## 2015-09-16 MED ORDER — OXYTOCIN BOLUS FROM INFUSION: 500.0000 mL | INTRAVENOUS | Status: DC

## 2015-09-16 MED ORDER — OXYCODONE-ACETAMINOPHEN 5-325 MG PO TABS
1.0000 | ORAL_TABLET | ORAL | Status: DC | PRN
Start: 2015-09-16 — End: 2015-09-17

## 2015-09-16 MED ORDER — EPHEDRINE 5 MG/ML INJ: 10.0000 mg | INTRAVENOUS | Status: DC | PRN

## 2015-09-16 MED ORDER — OXYTOCIN 40 UNITS IN LACTATED RINGERS INFUSION - SIMPLE MED
2.5000 [IU]/h | INTRAVENOUS | Status: DC
  Filled 2015-09-16: qty 1000

## 2015-09-16 MED ORDER — ONDANSETRON HCL 4 MG/2ML IJ SOLN: 4.0000 mg | Freq: Four times a day (QID) | INTRAMUSCULAR | Status: DC | PRN

## 2015-09-16 MED ORDER — LIDOCAINE HCL (PF) 1 % IJ SOLN
30.0000 mL | INTRAMUSCULAR | Status: DC | PRN
  Filled 2015-09-16: qty 30

## 2015-09-16 MED ORDER — LACTATED RINGERS IV SOLN
500.0000 mL | Freq: Once | INTRAVENOUS | Status: AC
  Administered 2015-09-16: 500 mL via INTRAVENOUS

## 2015-09-16 MED ORDER — FENTANYL 2.5 MCG/ML BUPIVACAINE 1/10 % EPIDURAL INFUSION (WH - ANES)
14.0000 mL/h | INTRAMUSCULAR | Status: DC | PRN
  Administered 2015-09-16: 12 mL/h via EPIDURAL
  Filled 2015-09-16: qty 125

## 2015-09-16 MED ORDER — BUPIVACAINE HCL (PF) 0.25 % IJ SOLN
INTRAMUSCULAR | Status: DC | PRN
  Administered 2015-09-16 (×2): 4 mL via EPIDURAL

## 2015-09-16 MED ORDER — LACTATED RINGERS IV SOLN
INTRAVENOUS | Status: DC
  Administered 2015-09-16 (×2): via INTRAVENOUS

## 2015-09-16 MED ORDER — DIPHENHYDRAMINE HCL 50 MG/ML IJ SOLN: 12.5000 mg | INTRAMUSCULAR | Status: DC | PRN

## 2015-09-16 MED ORDER — ACETAMINOPHEN 325 MG PO TABS: 650.0000 mg | ORAL_TABLET | ORAL | Status: DC | PRN

## 2015-09-16 MED ORDER — PHENYLEPHRINE 40 MCG/ML (10ML) SYRINGE FOR IV PUSH (FOR BLOOD PRESSURE SUPPORT)
80.0000 ug | PREFILLED_SYRINGE | INTRAVENOUS | Status: DC | PRN
Start: 2015-09-16 — End: 2015-09-17
  Filled 2015-09-16: qty 10

## 2015-09-16 MED ORDER — CITRIC ACID-SODIUM CITRATE 334-500 MG/5ML PO SOLN: 30.0000 mL | ORAL | Status: DC | PRN

## 2015-09-16 MED ORDER — OXYCODONE-ACETAMINOPHEN 5-325 MG PO TABS: 2.0000 | ORAL_TABLET | ORAL | Status: DC | PRN

## 2015-09-16 MED ORDER — LIDOCAINE-EPINEPHRINE (PF) 2 %-1:200000 IJ SOLN
INTRAMUSCULAR | Status: DC | PRN
  Administered 2015-09-16: 3 mL

## 2015-09-16 MED ORDER — LACTATED RINGERS IV SOLN: 500.0000 mL | INTRAVENOUS | Status: DC | PRN

## 2015-09-16 NOTE — Anesthesia Preprocedure Evaluation (Signed)
Anesthesia Evaluation  Patient identified by MRN, date of birth, ID band Patient awake    Reviewed: Allergy & Precautions, Patient's Chart, lab work & pertinent test results  History of Anesthesia Complications Negative for: history of anesthetic complications  Airway Mallampati: I  TM Distance: >3 FB Neck ROM: Full    Dental  (+) Teeth Intact   Pulmonary neg pulmonary ROS,  breath sounds clear to auscultation        Cardiovascular negative cardio ROS  Rhythm:Regular     Neuro/Psych negative neurological ROS  negative psych ROS   GI/Hepatic negative GI ROS, Neg liver ROS,   Endo/Other  negative endocrine ROS  Renal/GU negative Renal ROS     Musculoskeletal   Abdominal   Peds  Hematology negative hematology ROS (+)   Anesthesia Other Findings   Reproductive/Obstetrics (+) Pregnancy                             Anesthesia Physical Anesthesia Plan  ASA: II  Anesthesia Plan: Epidural   Post-op Pain Management:    Induction:   Airway Management Planned:   Additional Equipment:   Intra-op Plan:   Post-operative Plan:   Informed Consent: I have reviewed the patients History and Physical, chart, labs and discussed the procedure including the risks, benefits and alternatives for the proposed anesthesia with the patient or authorized representative who has indicated his/her understanding and acceptance.   Dental advisory given  Plan Discussed with: Anesthesiologist  Anesthesia Plan Comments:         Anesthesia Quick Evaluation  

## 2015-09-16 NOTE — Telephone Encounter (Signed)
Pt brother called and asked for appt for sister. She is not feeling well and has missed 2 appts. First available is 6/14 but she is due 6/6. Can we work her in?

## 2015-09-16 NOTE — Progress Notes (Signed)
Pt checked out AMA d/t refusal of female provider. Translator services used to communicate the risks of her decision. Pt acknowledged the risks of transporting to another hospital.   [facility]

## 2015-09-16 NOTE — Final Progress Note (Addendum)
L&D OB Triage Note  SUBJECTIVE Elizabelle Norva PavlovSalah Ahlin is a 30 y.o. [phone] female at 5533w0d, EDD Estimated Date of Delivery: 09/30/15 who presented to triage with complaints of contractions. Cervical change is consistent with labor. Pt refuses my services because of gender. Pt is Muslim and declines female gynecologist services. Dr Valentino Saxonherry and Harlow MaresMelody Shambley are out of state and unavailable. .      OBJECTIVE There were no vitals taken for this visit. Nursing Evaluation: There were no vitals taken for this visit.  Cervix: 5 cm, BOWI  NST was performed and has been reviewed by me.  NST INTERPRETATION: Indications: Contractions  Mode: External Baseline Rate (A): 155 bpm Variability: Moderate Accelerations: 15 x 15 Decelerations: None     Contraction Frequency (min): occasional   ASSESSMENT Impression:  1. Pregnancy:  [id number] at 733w0d , EDD Estimated Date of Delivery: 09/30/15; Rubella non immune, Varicella non immune; Anemia; Late entry to prenatal care 2. Reactive NST 3. Pt in Labor and declines hospital care from on call physician (female). No Female provider available to assume care.  PLAN 1. Pt to sign out AMA, (in labor; not accepting of physician care) 2. [facility]Pratt, [facility],  notified and willing to accept pt for delivery. 3. Self transportation.    Herold HarmsMartin A Larisha Vencill, MD

## 2015-09-16 NOTE — Telephone Encounter (Signed)
Called Onalee HuaDavid (pt's brother) he states that he has taken pt to the ED.

## 2015-09-16 NOTE — Progress Notes (Addendum)
[facility] due to patient cultural preference for a female provider. Dr Dalbert GarnetBeasley agreed to take over care of this patient for Dr. Greggory Keenefrancesco.

## 2015-09-16 NOTE — Anesthesia Procedure Notes (Signed)
Epidural Patient location during procedure: OB  Staffing Anesthesiologist: Pihu Basil Performed by: anesthesiologist   Preanesthetic Checklist Completed: patient identified, surgical consent, pre-op evaluation, timeout performed, IV checked, risks and benefits discussed and monitors and equipment checked  Epidural Patient position: sitting Prep: DuraPrep Patient monitoring: heart rate, cardiac monitor, continuous pulse ox and blood pressure Approach: midline Location: L4-L5 Injection technique: LOR saline  Needle:  Needle type: Tuohy  Needle gauge: 17 G Needle length: 9 cm Needle insertion depth: 6 cm Catheter type: closed end flexible Catheter size: 19 Gauge Catheter at skin depth: 12 cm Test dose: negative and 2% lidocaine with Epi 1:200 K  Assessment Events: blood not aspirated, injection not painful, no injection resistance, negative IV test and no paresthesia  Additional Notes Reason for block:procedure for pain   

## 2015-09-16 NOTE — H&P (Signed)
OBSTETRIC ADMISSION HISTORY AND PHYSICAL  Alison Salazar is a 30 y.o. female [phone] with IUP at [redacted]w[redacted]d by 16wk [place] who presents with worsening, more frequent contractions x 3 days. She also reports a bloody clot passing around 1330 this afternoon. She reports +FMs and denies any LOF, current VB, blurry vision, headaches, peripheral edema, or fluid leakage.  She plans on breast and bottle feeding. She request POPs for birth control. Prenatal history is significant for late prenatal care (>20wks), Varicella and Rubella non-immune, and anemia. GBS status unknown. Pt is Arabic speaking.  Dating: By 16wk [place] --->  Estimated Date of Delivery: 09/30/15  Sono:    , CWD, normal anatomy, vertex presentation, 829g EFW   Prenatal History/Complications:  Past Medical History: Past Medical History  Diagnosis Date  . Medical history non-contributory     Past Surgical History: Past Surgical History  Procedure Laterality Date  . No past surgeries      Obstetrical History: OB History    Gravida Para Term Preterm AB TAB SAB Ectopic Multiple Living   0 0 0 0 0 0 3      Social History: Social History   Social History  . Marital Status: Married    Spouse Name: N/A  . Number of Children: N/A  . Years of Education: N/A   Social History Main Topics  . Smoking status: Never Smoker   . Smokeless tobacco: Never Used  . Alcohol Use: No  . Drug Use: No  . Sexual Activity: Yes   Other Topics Concern  . None   Social History Narrative    Family History: History reviewed. No pertinent family history.  Allergies: No Known Allergies  Prescriptions prior to admission  Medication Sig Dispense Refill Last Dose  . Prenat-FeFum-FePo-FA-Omega 3 (CONCEPT DHA) 53.5-38-1 MG CAPS Take 1 tablet by mouth daily. 30 capsule 6 Past Month at Unknown time  . terconazole (TERAZOL 7) 0.4 % vaginal cream Place 1 applicator vaginally at bedtime. For 7 nights (Patient not taking: Reported on  09/16/2015) 45 g 0 Not Taking at Unknown time     Review of Systems  GENERAL:  No fever, chills, weakness HEENT:  No vision changes, congestion, or throat pain. SKIN:  No rash or bruising CARDIOVASCULAR:  No chest pain, palpitations or edema. RESPIRATORY:  No shortness of breath, no cough GASTROINTESTINAL:  No nausea, vomiting or diarrhea. No abdominal pain. GENITOURINARY:  IUP Pregnancy. No current vaginal bleeding, passed single clot earlier this afternoon NEUROLOGICAL:  No headache, dizziness, or numbness MUSCULOSKELETAL:  No back pain. ENDOCRINOLOGIC:  No reports of sweating, cold or heat intolerance.  Height 5' (1.524 m), weight 58.968 kg (130 lb). General appearance: alert, cooperative and appears stated age Lungs: clear to auscultation bilaterally Heart: regular rate and rhythm, no murmurs Abdomen: soft, non-tender; bowel sounds normal Pelvic: See below Extremities: Homans sign is negative, trace edema, no ecchymosis DTR's: equal bilaterally Presentation: cephalic Fetal monitoring: 150/mod/15x15 accels/no decels Dilation: 5.5 Effacement (%): 70 Station: -2 Exam by:: Alison Mu, RN   Prenatal labs: ABO, Rh: --/--/A NEG (01/28 1213) Antibody:  POS Rubella: Non-immune RPR: Non Reactive (01/28 1219)  HBsAg:   unknown HIV: Non Reactive (01/28 1219)  GBS:   unknown Varicella: non-immune 1 hr Glucola: 210 2 hr: 164 3 hr: 134 Genetic screening: unknown Anatomy US: normal  Prenatal Transfer Tool  Maternal Diabetes: No Genetic Screening: Unknown Maternal Ultrasounds/Referrals: Normal Fetal Ultrasounds or other Referrals:  None Maternal Substance Abuse:  No  Significant Maternal Medications:  None Significant Maternal Lab Results: None. GBS Unknown  Results for orders placed or performed during the hospital encounter of 09/16/15 (from the past 24 hour(s))  CBC   Collection Time: 09/16/15  8:05 PM  Result Value Ref Range   WBC 14.4 (H) 4.0 - 10.5 K/uL   RBC 4.06  3.87 - 5.11 MIL/uL   Hemoglobin 10.8 (L) 12.0 - 15.0 g/dL   HCT 45.432.8 (L) 09.836.0 - 11.946.0 %   MCV 80.8 78.0 - 100.0 fL   MCH 26.6 26.0 - 34.0 pg   MCHC 32.9 30.0 - 36.0 g/dL   RDW 14.713.4 82.911.5 - 56.215.5 %   Platelets 289 150 - 400 K/uL    Patient Active Problem List   Diagnosis Date Noted  . Active labor 09/16/2015  . Late prenatal care affecting pregnancy in third trimester, antepartum 06/07/2015  . Rh negative state in antepartum period 06/07/2015  . Rubella non-immune status, antepartum 06/07/2015  . Maternal varicella, non-immune 06/07/2015    Assessment: Alison Salazar is a 30 y.o. [phone] at 421w0d here for onset of contractions in stable condition.  #Labor: Progressing well without intervention. Will continue to monitor #Pain: Requests epidural. Anesthesia consulted. #FWB: category 1 strip #ID: GBS unknown - sample collected #MOF: Breast and Bottle #MOC: POPs #Circ: N/A, Baby Girl  Alison Salazar, Med Student  09/16/2015, 10:09 PM  OB fellow attestation:  I have seen and examined this patient; I agree with above documentation in the medical student's note.   Alison Norva PavlovSalah Salazar is a 30 y.o. [phone] here for active labor  PE: BP 104/62 mmHg  Pulse 83  Temp(Src) 98.1 F (36.7 C) (Oral)  Resp 18  Ht 5' (1.524 m)  Wt 130 lb (58.968 kg)  BMI 25.39 kg/m2  SpO2 100%  Breastfeeding? Unknown Gen: calm comfortable, NAD Resp: normal effort, no distress Abd: gravid  ROS, labs, PMH reviewed  Plan: Admit to LD Labor: expectant managment FWB:Cat I ID: GBS pending  Alison FlakeKimberly Niles Ajit Errico, MD Family Medicine, OB Fellow 09/17/2015, 7:08 AM

## 2015-09-16 NOTE — Progress Notes (Signed)
Alison Salazar is a 30 y.o. [phone] at 2068w0d admitted for active labor  Subjective: Comfortable with epidural. Denies any other concerns. Visit conducted with aid of Arabic Interpretor.  Objective: BP 107/58 mmHg  Pulse 103  Temp(Src) 97.9 F (36.6 C) (Axillary)  Resp 16  Ht 5' (1.524 m)  Wt 130 lb (58.968 kg)  BMI 25.39 kg/m2  SpO2 100%      FHT:  FHR: 150 bpm, variability: moderate,  accelerations:  Present,  decelerations:  Absent UC:   Regular SVE:   Dilation: 7 Effacement (%): 90 Station: +1 Exam by:: D Jasso, RN  Labs: Lab Results  Component Value Date   WBC 14.4* 09/16/2015   HGB 10.8* 09/16/2015   HCT 32.8* 09/16/2015   MCV 80.8 09/16/2015   PLT 289 09/16/2015    Assessment / Plan: Spontaneous labor, progressing normally  Labor: Progressing normally Preeclampsia:  no signs or symptoms of toxicity Fetal Wellbeing:  Category I Pain Control:  Epidural I/D:  GBS pending Anticipated MOD:  NSVD  [place] Cono Gebhard 09/16/2015, 11:41 PM

## 2015-09-17 ENCOUNTER — Encounter (HOSPITAL_COMMUNITY): Payer: Self-pay

## 2015-09-17 DIAGNOSIS — Z3A38 38 weeks gestation of pregnancy: Secondary | ICD-10-CM

## 2015-09-17 DIAGNOSIS — Z6791 Unspecified blood type, Rh negative: Secondary | ICD-10-CM

## 2015-09-17 DIAGNOSIS — Z79899 Other long term (current) drug therapy: Secondary | ICD-10-CM

## 2015-09-17 MED ORDER — ONDANSETRON HCL 4 MG/2ML IJ SOLN: 4.0000 mg | INTRAMUSCULAR | Status: DC | PRN

## 2015-09-17 MED ORDER — COCONUT OIL OIL: 1.0000 "application " | TOPICAL_OIL | Status: DC | PRN

## 2015-09-17 MED ORDER — WITCH HAZEL-GLYCERIN EX PADS: 1.0000 "application " | MEDICATED_PAD | CUTANEOUS | Status: DC | PRN

## 2015-09-17 MED ORDER — OXYCODONE-ACETAMINOPHEN 5-325 MG PO TABS
2.0000 | ORAL_TABLET | ORAL | Status: DC | PRN
Start: 2015-09-17 — End: 2015-09-18

## 2015-09-17 MED ORDER — IBUPROFEN 600 MG PO TABS
600.0000 mg | ORAL_TABLET | Freq: Four times a day (QID) | ORAL | Status: DC
  Administered 2015-09-17 – 2015-09-18 (×5): 600 mg via ORAL
  Filled 2015-09-17 (×5): qty 1

## 2015-09-17 MED ORDER — SIMETHICONE 80 MG PO CHEW: 80.0000 mg | CHEWABLE_TABLET | ORAL | Status: DC | PRN

## 2015-09-17 MED ORDER — BENZOCAINE-MENTHOL 20-0.5 % EX AERO
1.0000 "application " | INHALATION_SPRAY | CUTANEOUS | Status: DC | PRN
  Administered 2015-09-17: 1 via TOPICAL
  Filled 2015-09-17: qty 56

## 2015-09-17 MED ORDER — OXYCODONE-ACETAMINOPHEN 5-325 MG PO TABS
1.0000 | ORAL_TABLET | ORAL | Status: DC | PRN
  Administered 2015-09-17 (×2): 1 via ORAL
  Filled 2015-09-17 (×2): qty 1

## 2015-09-17 MED ORDER — ZOLPIDEM TARTRATE 5 MG PO TABS: 5.0000 mg | ORAL_TABLET | Freq: Every evening | ORAL | Status: DC | PRN

## 2015-09-17 MED ORDER — ONDANSETRON HCL 4 MG PO TABS: 4.0000 mg | ORAL_TABLET | ORAL | Status: DC | PRN

## 2015-09-17 MED ORDER — SENNOSIDES-DOCUSATE SODIUM 8.6-50 MG PO TABS
2.0000 | ORAL_TABLET | ORAL | Status: DC
  Administered 2015-09-17: 2 via ORAL
  Filled 2015-09-17: qty 2

## 2015-09-17 MED ORDER — PRENATAL MULTIVITAMIN CH
1.0000 | ORAL_TABLET | Freq: Every day | ORAL | Status: DC
  Administered 2015-09-17 – 2015-09-18 (×2): 1 via ORAL
  Filled 2015-09-17 (×2): qty 1

## 2015-09-17 MED ORDER — DIBUCAINE 1 % RE OINT: 1.0000 "application " | TOPICAL_OINTMENT | RECTAL | Status: DC | PRN

## 2015-09-17 MED ORDER — TETANUS-DIPHTH-ACELL PERTUSSIS 5-2.5-18.5 LF-MCG/0.5 IM SUSP: 0.5000 mL | Freq: Once | INTRAMUSCULAR | Status: DC

## 2015-09-17 MED ORDER — DIPHENHYDRAMINE HCL 25 MG PO CAPS: 25.0000 mg | ORAL_CAPSULE | Freq: Four times a day (QID) | ORAL | Status: DC | PRN

## 2015-09-17 MED ORDER — ACETAMINOPHEN 325 MG PO TABS: 650.0000 mg | ORAL_TABLET | ORAL | Status: DC | PRN

## 2015-09-17 NOTE — Lactation Note (Signed)
This note was copied from a baby's chart. Lactation Consultation Note Initial visit at 15 hours of age using Arabic interpreter Dalia [phone].  Mom reports a few good feedings early and then baby has been sleepy and not latching with attempts.  Baby has been spitting up and explained to mom baby may not act hungry until baby clears out the mucus.  Mom has experience with 3 older babies breastfeeding for 2 months with bottles of formula given at the beginning and then she had a low milk supply.  LC discussed normal progression of milk supply and establishing a good supply. Encouraged mom to attempt exclusively breastfeeding and call for assist as needed.  [facility] resources given and discussed.  Encouraged to feed with early cues on demand.  Early newborn behavior discussed.  Hand expression demonstrated by mom with colostrum visible.  Mom to call for assist as needed.    Patient Name: Alison Reino KentKafa Loeber EXBMW'UToday's Date: 09/17/2015 Reason for consult: Initial assessment   Maternal Data Has patient been taught Hand Expression?: Yes Does the patient have breastfeeding experience prior to this delivery?: Yes  Feeding    LATCH Score/Interventions                      Lactation Tools Discussed/Used WIC Program: No (encouraged to call)   Consult Status Consult Status: Follow-up Date: 09/18/15 Follow-up type: In-patient    Beverely RisenShoptaw, Arvella MerlesJana Lynn 09/17/2015, 8:56 PM

## 2015-09-17 NOTE — Anesthesia Postprocedure Evaluation (Signed)
Anesthesia Post Note  Patient: Alison KlinefelterKafa Salah Salazar  Procedure(s) Performed: * No procedures listed *  Patient location during evaluation: Mother Baby Anesthesia Type: Epidural Level of consciousness: awake and alert Pain management: pain level controlled Vital Signs Assessment: post-procedure vital signs reviewed and stable Respiratory status: spontaneous breathing, nonlabored ventilation and respiratory function stable Cardiovascular status: stable Postop Assessment: no headache, no backache and epidural receding Anesthetic complications: no     Last Vitals:  Filed Vitals:   09/17/15 0800 09/17/15 0900  BP: 99/64 90/45  Pulse: 80 88  Temp: 36.7 C 36.8 C  Resp: 18 20    Last Pain:  Filed Vitals:   09/17/15 1125  PainSc: 3    Pain Goal:                 Gee Habig

## 2015-09-18 LAB — CBC
HEMATOCRIT: 29.2 % — AB (ref 36.0–46.0)
HEMOGLOBIN: 9.5 g/dL — AB (ref 12.0–15.0)
MCH: 26.3 pg (ref 26.0–34.0)
MCHC: 32.5 g/dL (ref 30.0–36.0)
MCV: 80.9 fL (ref 78.0–100.0)
Platelets: 255 10*3/uL (ref 150–400)
RBC: 3.61 MIL/uL — ABNORMAL LOW (ref 3.87–5.11)
RDW: 13.5 % (ref 11.5–15.5)
WBC: 12.8 10*3/uL — ABNORMAL HIGH (ref 4.0–10.5)

## 2015-09-18 LAB — RPR: RPR Ser Ql: NONREACTIVE

## 2015-09-18 MED ORDER — MEASLES, MUMPS & RUBELLA VAC ~~LOC~~ INJ
0.5000 mL | INJECTION | Freq: Once | SUBCUTANEOUS | Status: AC
  Administered 2015-09-18: 0.5 mL via SUBCUTANEOUS
  Filled 2015-09-18: qty 0.5

## 2015-09-18 MED ORDER — IBUPROFEN 600 MG PO TABS: 600.0000 mg | ORAL_TABLET | Freq: Four times a day (QID) | ORAL | Status: AC | PRN

## 2015-09-18 NOTE — Lactation Note (Signed)
This note was copied from a baby's chart. Lactation Consultation Note Lactation Consultation Note Doesn't appear to be supplementing much. W/interpreter stressed importance of supplementing after BF d/t SGA. Gave mom LPI information sheet in regards to supplementing according to age. Mom has colostrum, expressed breast to demonstrate colostrum. Mom gave 10ml formula, demonstrated sheet to increase to 15-1220ml according to age. Stress importance of I&O. Reported to RN Patient Name: [name] Reason for consult: Follow-up assessment;Infant < 6lbs Patient Name: [name]     Maternal Data    Feeding    LATCH Score/Interventions                      Lactation Tools Discussed/Used     Consult Status      Thompson Mckim, Diamond NickelLAURA G Salazar, 3:19 AM

## 2015-09-18 NOTE — Discharge Instructions (Signed)

## 2015-09-18 NOTE — Discharge Summary (Signed)
OB Discharge Summary     Patient Name: Alison Salazar DOB: 01/04/1986 MRN: [id number]  Date of admission: 09/16/2015 Delivering MD: Araceli BoucheUMLEY, [place] N   Date of discharge: 09/18/2015  Admitting diagnosis: ctx Intrauterine pregnancy: 5770w1d     Secondary diagnosis:  Principal Problem:   Active labor Active Problems:   Late prenatal care affecting pregnancy in third trimester, antepartum  Additional problems: GBS unknown; Rh neg     Discharge diagnosis: Term Pregnancy Delivered                                                                                                Post partum procedures:none (infant Rh neg also)  Augmentation: none  Complications: None  Hospital course:  Onset of Labor With Vaginal Delivery     30 y.o. yo G4P4001 at 4870w1d was admitted in Active Labor on 09/16/2015. Patient had an uncomplicated labor course as follows:  Membrane Rupture Time/Date: 5:00 AM ,09/17/2015   Intrapartum Procedures: Episiotomy: None [1]                                         Lacerations:  2nd degree [3]  Patient had a delivery of a Viable infant. 09/17/2015  Information for the patient's newborn:  Alison Salazar, Girl [name] [[id number]][[id number]]  Delivery Method: Vaginal, Spontaneous Delivery (Filed from Delivery Summary)    Pateint had an uncomplicated postpartum course.  She is ambulating, tolerating a regular diet, passing flatus, and urinating well. Patient is discharged home in stable condition on 09/18/2015.     Physical exam  Filed Vitals:   09/17/15 1302 09/17/15 1808 09/17/15 2100 09/18/15 0555  BP: 109/54 110/60 119/73 93/65  Pulse: 68 72 73 73  Temp: 98 F (36.7 C) 98.2 F (36.8 C) 98.1 F (36.7 C) 98 F (36.7 C)  TempSrc: Oral Oral Oral Oral  Resp: 18 18 18 20   Height:      Weight:      SpO2:       General: alert and cooperative Lochia: appropriate Uterine Fundus: firm Incision: N/A DVT Evaluation: No evidence of DVT seen on physical exam. Labs: Lab Results   Component Value Date   WBC 12.8* 09/18/2015   HGB 9.5* 09/18/2015   HCT 29.2* 09/18/2015   MCV 80.9 09/18/2015   PLT 255 09/18/2015   CMP Latest Ref Rng 04/15/2015  Glucose 65 - 99 mg/dL 88  BUN 6 - 20 mg/dL 7  Creatinine 7.820.44 - 9.561.00 mg/dL 2.13(Y0.32(L)  Sodium 865135 - 784145 mmol/L 135  Potassium 3.5 - 5.1 mmol/L 3.6  Chloride 101 - 111 mmol/L 107  CO2 22 - 32 mmol/L 22  Calcium 8.9 - 10.3 mg/dL 9.3  Total Protein 6.5 - 8.1 g/dL 7.0  Total Bilirubin 0.3 - 1.2 mg/dL 6.9(G0.2(L)  Alkaline Phos 38 - 126 U/L 38  AST 15 - 41 U/L 16  ALT 14 - 54 U/L 12(L)    Discharge instruction: per After Visit Summary and "Baby and Me Booklet".  After visit  meds:    Medication List    STOP taking these medications        terconazole 0.4 % vaginal cream  Commonly known as:  TERAZOL 7      TAKE these medications        CONCEPT DHA 53.5-38-1 MG Caps  Take 1 tablet by mouth daily.     ibuprofen 600 MG tablet  Commonly known as:  ADVIL,MOTRIN  Take 1 tablet (600 mg total) by mouth every 6 (six) hours as needed.        Diet: routine diet  Activity: Advance as tolerated. Pelvic rest for 6 weeks.   Outpatient follow up:6 weeks Follow up Appt:No future appointments. Follow up Visit:No Follow-up on file.  Postpartum contraception: Combination OCPs  Newborn Data: Live born female  Birth Weight: 7 lb 3.2 oz (3265 g) APGAR: 9, 9  Baby Feeding: Bottle and Breast Disposition:home with mother   09/18/2015 Cam Hai, CNM  8:21 AM

## 2015-09-18 NOTE — Lactation Note (Signed)
This note was copied from a baby's chart. Lactation Consultation Note: Arabic Interpreter ID # [id number] on the line at bedside for all teaching.  Mothers breast are firm and filling. Hand express transitional milk.  Mother observed good flow of milk.  Mother using a cradle hold to latch baby and felt some pinching.  mother taught cross cradle hold and using a C hold. Infant sustained latch for 15 mins with audible swallows. Mother describes no pain with feeding. Assist mother with football hold on the alternate breast.   Mother taught breast compression. Observed good rhythmic suckling and audible swallows.  Infant sustained latch for another 15 mins. Mother very receptive to all teaching.  Mother was given a harmony hand pump and instructions to use.  Reviewed engorgement treatment which was highlighted in Baby and Me Book.   Patient Name: Alison Salazar ZOXWR'UToday's Date: 09/18/2015 Reason for consult: Follow-up assessment   Maternal Data    Feeding Feeding Type: Breast Fed Length of feed: 15 min (obs for 15min and infant remains on the breast. )  LATCH Score/Interventions Latch: Grasps breast easily, tongue down, lips flanged, rhythmical sucking. Intervention(s): Skin to skin;Teach feeding cues Intervention(s): Adjust position;Assist with latch;Breast massage;Breast compression  Audible Swallowing: Spontaneous and intermittent  Type of Nipple: Everted at rest and after stimulation  Comfort (Breast/Nipple): Filling, red/small blisters or bruises, mild/mod discomfort     Hold (Positioning): Assistance needed to correctly position infant at breast and maintain latch.  LATCH Score: 8  Lactation Tools Discussed/Used     Consult Status Consult Status: Complete    Alison Salazar, Alison Salazar 09/18/2015, 12:24 PM

## 2015-09-18 NOTE — Lactation Note (Signed)
This note was copied from a baby's chart. Lactation Consultation Note See other noted.  Patient Name: [name] WUJWJ'XToday's Date: 09/18/2015 Reason for consult: Follow-up assessment;Infant < 6lbs   Maternal Data    Feeding    LATCH Score/Interventions                      Lactation Tools Discussed/Used     Consult Status Consult Status: Follow-up Date: 09/18/15 (in pm) Follow-up type: In-patient    Ran Tullis, Diamond NickelLAURA G 09/18/2015, 3:20 AM

## 2015-09-18 NOTE — Progress Notes (Signed)
Post Partum Day 1 Subjective:  Taneah Norva PavlovSalah Corporan is a 30 y.o. [id number] 7123w1d s/p NSVD yesterday morning.  No acute events overnight.  Pt denies problems with ambulating, voiding or po intake.  She denies nausea or vomiting.  Pain is moderately controlled, she complains of pain with changing position or ambulating.  She has not had flatus.  Lochia Minimal.  Plan for birth control is oral progesterone-only contraceptive.  Method of Feeding: Breast feeding - going well.  Objective: Blood pressure 93/65, pulse 73, temperature 98 F (36.7 C), temperature source Oral, resp. rate 20, height 5' (1.524 m), weight 58.968 kg (130 lb), SpO2 100 %, breastfeeding.  Physical Exam:  General: alert, cooperative and no distress Lochia:normal flow Chest: normal WOB Heart: Regular rate/rhythm. No murmurs. Abdomen: +BS, soft, mild TTP (appropriate) Uterine Fundus: firm DVT Evaluation: No evidence of DVT seen on physical exam. Extremities: No edema   Recent Labs  09/16/15 2005 09/18/15 0526  HGB 10.8* 9.5*  HCT 32.8* 29.2*    Assessment/Plan:  ASSESSMENT: Meda KlinefelterKafa Salah Garduno is a 30 y.o. [id number] 7323w1d s/p NSVD yesterday, doing well.  #Pain Control: Pain is well controlled while at rest, but worse with changing positions. - Will try sits bath for pain improvement. #FEN/GI: Tolerating normal diet well. #postpartum care: -Continue routine PP care -Breastfeeding support PRN - Lactation Consult - Contraception: POP's -Anticipate d/c tomorrow  LOS: 2 day  Barnie Mortolleen McGuire, Med Student  I have seen and examined this patient. See my Discharge Summary for today. Cam HaiSHAW, Shelly Shoultz 8:22 AM 09/18/2015

## 2015-09-20 LAB — TYPE AND SCREEN
ABO/RH(D): A NEG
Antibody Screen: POSITIVE
DAT, IGG: NEGATIVE
Unit division: 0
Unit division: 0

## 2015-10-30 ENCOUNTER — Ambulatory Visit: Payer: Medicaid Other | Admitting: Obstetrics and Gynecology

## 2017-12-15 IMAGING — US US OB LIMITED
1 series · 14 of 28 positions shown · non-contrast
Comparison: none

CLINICAL DATA: 29-year-old pregnant female with abdominal pain x2
weeks.

EXAM:
LIMITED OBSTETRIC ULTRASOUND

[Series 1: us ob limited · 0.20mm/px · 14 of 72 slices shown]
[im 3/72]
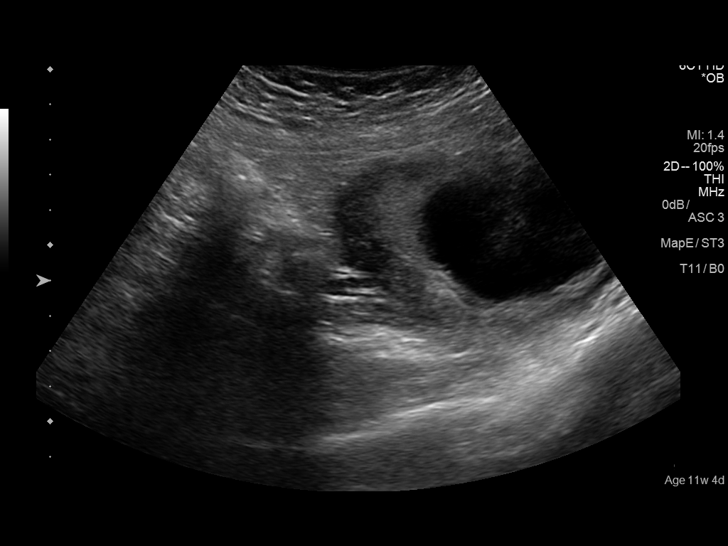
[im 8/72]
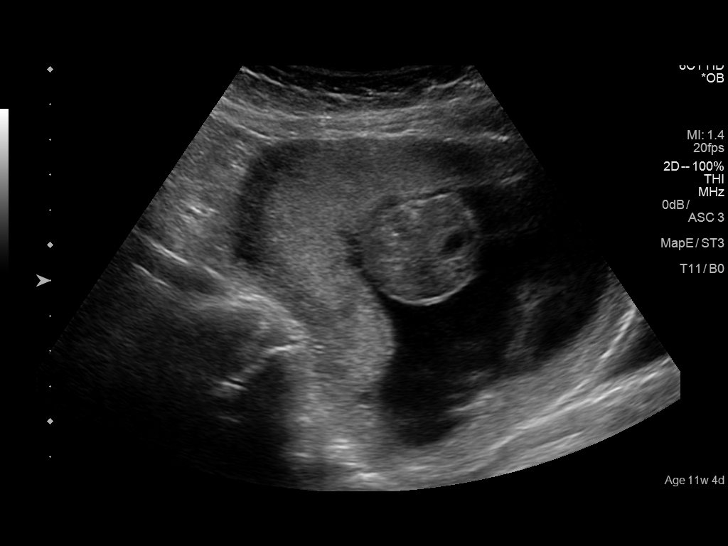
[im 14/72]
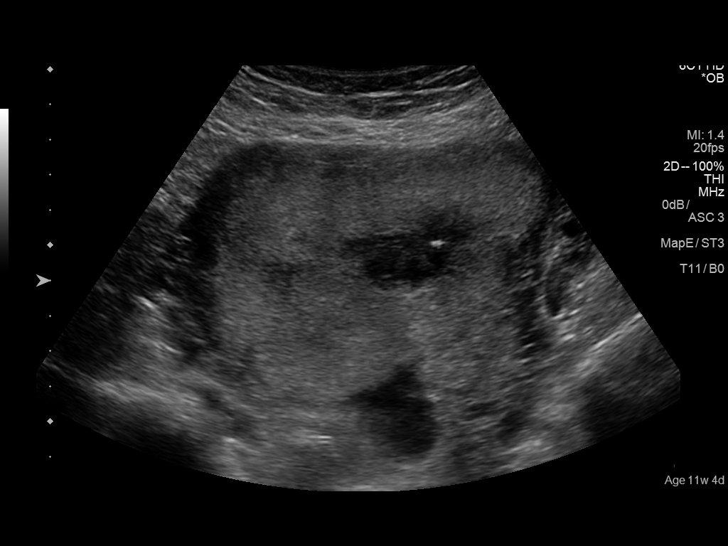
[im 19/72]
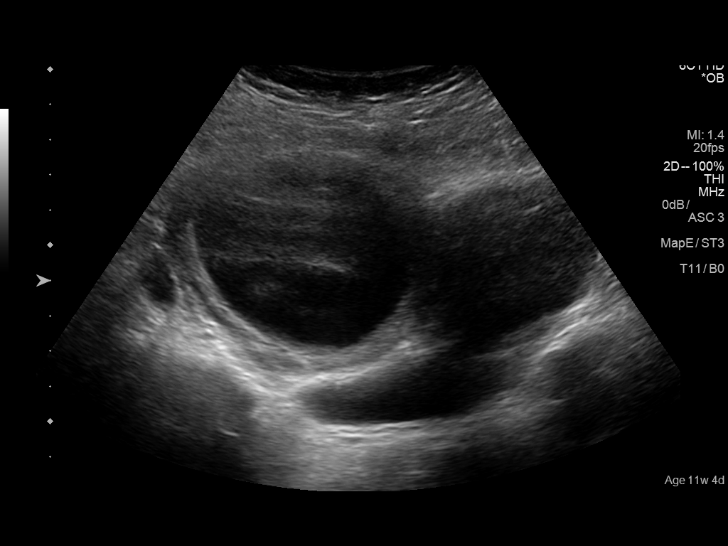
[im 24/72]
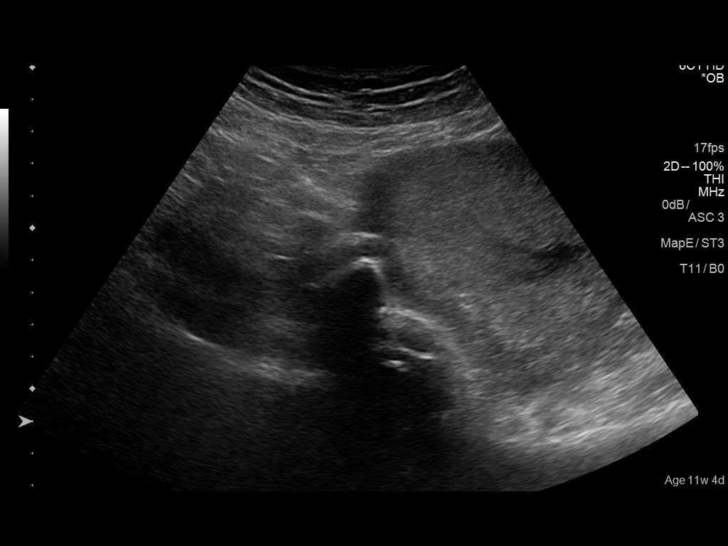
[im 29/72]
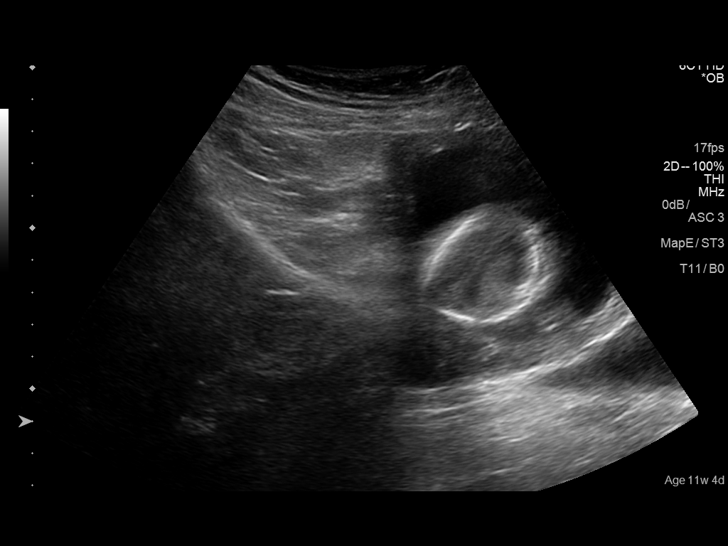
[im 35/72]
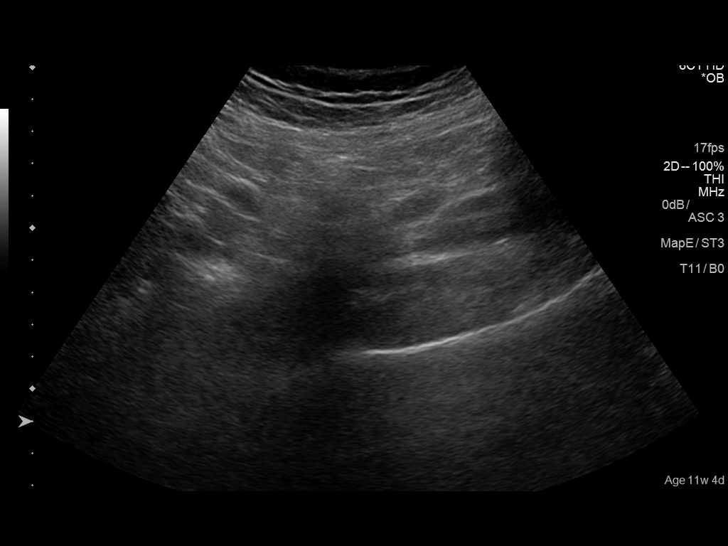
[im 40/72]
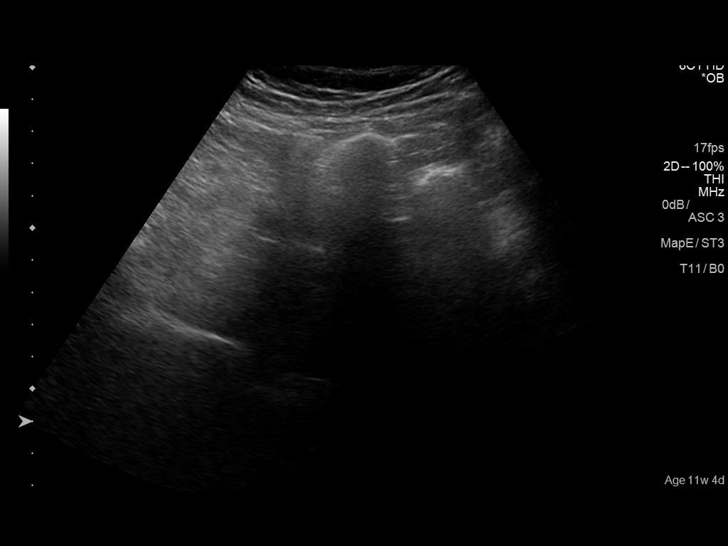
[im 45/72]
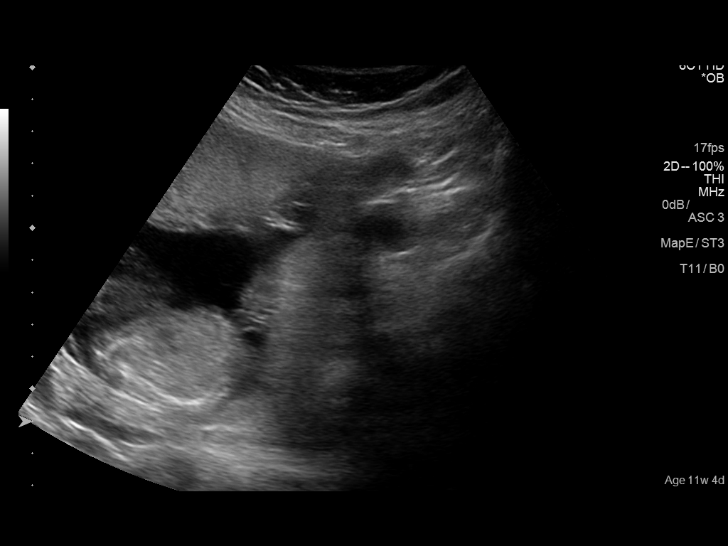
[im 50/72]
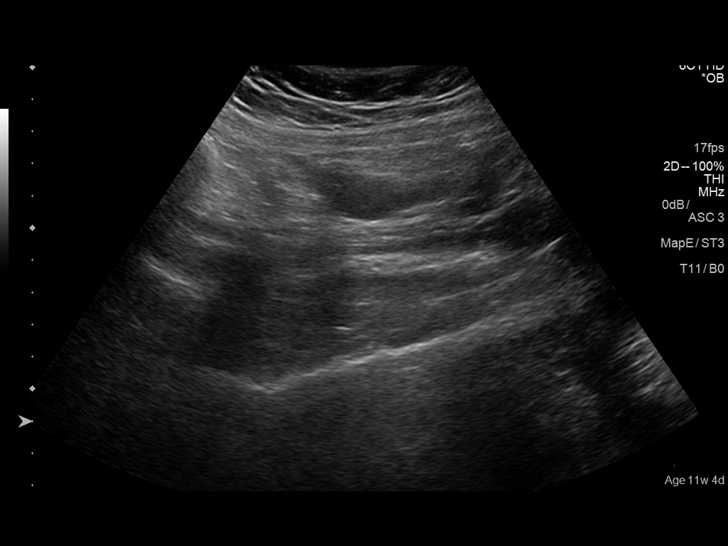
[im 56/72]
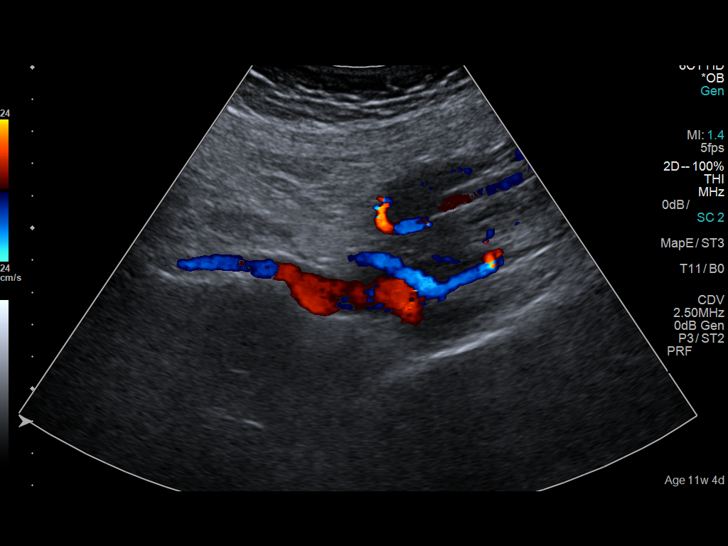
[im 61/72]
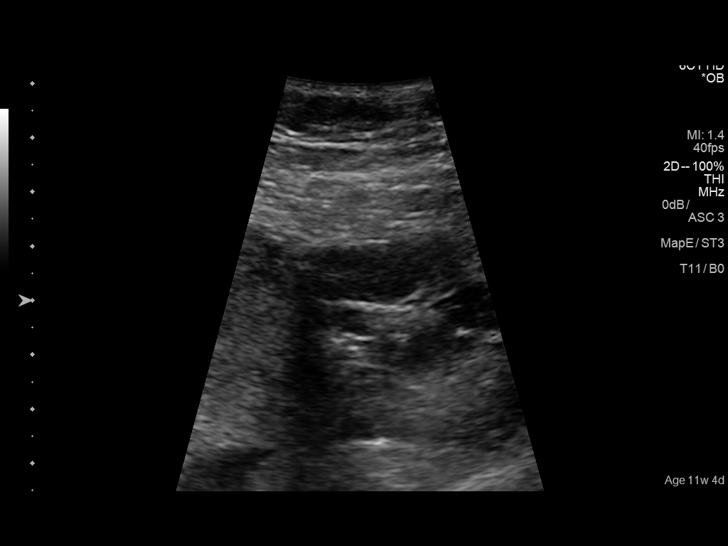
[im 66/72]
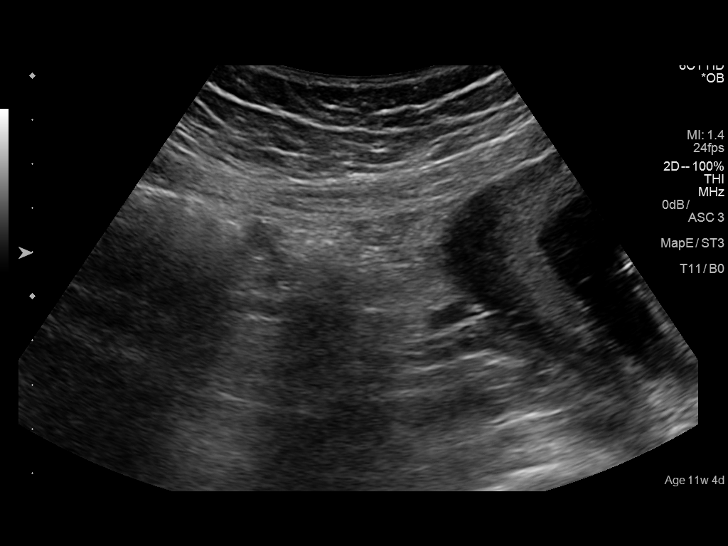
[im 72/72]
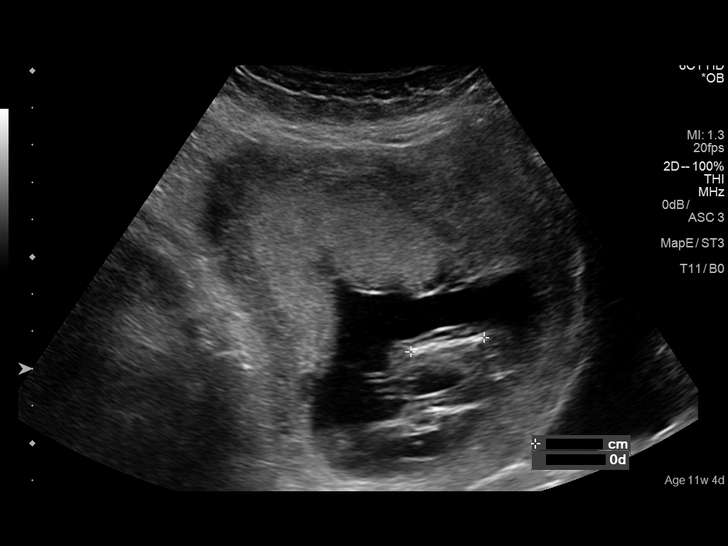

[14 of 28 positions shown; findings below may reference images not displayed]

FINDINGS: Number of Fetuses: 1

Heart Rate:  160 bpm

Movement: Yes

Presentation: Cephalic

Placental Location: Right lateral

Previa: No

Amniotic Fluid (Subjective):  Within normal limits.

BPD:  Unable to obtain.

FL:  2Cm 16w  0d

MATERNAL FINDINGS:

Cervix:  Appears closed.

Uterus/Adnexae: The left ovary appears unremarkable. The right ovary
is not visualized due to overlying bowel gas.
IMPRESSION: Single live intrauterine pregnancy at estimated gestational age of
16 weeks, 0 days based on today's ultrasound measurements.

This exam is performed on an emergent basis and does not
comprehensively evaluate fetal size, dating, or anatomy; follow-up
complete OB US should be considered if further fetal assessment is
warranted.
